# Patient Record
Sex: Female | Born: 1980 | Race: White | Hispanic: No | Marital: Married | Smoking: Never smoker
Health system: Southern US, Community
[De-identification: ages and names within clinical notes are randomized; demographics above are authoritative.]

## PROBLEM LIST (undated history)

## (undated) DIAGNOSIS — K5792 Diverticulitis of intestine, part unspecified, without perforation or abscess without bleeding: Secondary | ICD-10-CM

## (undated) HISTORY — DX: Diverticulitis of intestine, part unspecified, without perforation or abscess without bleeding: K57.92

---

## 2011-02-15 DIAGNOSIS — K5790 Diverticulosis of intestine, part unspecified, without perforation or abscess without bleeding: Secondary | ICD-10-CM | POA: Insufficient documentation

## 2011-02-15 DIAGNOSIS — N200 Calculus of kidney: Secondary | ICD-10-CM | POA: Insufficient documentation

## 2011-03-07 DIAGNOSIS — N83209 Unspecified ovarian cyst, unspecified side: Secondary | ICD-10-CM | POA: Insufficient documentation

## 2012-10-15 DIAGNOSIS — E669 Obesity, unspecified: Secondary | ICD-10-CM | POA: Insufficient documentation

## 2016-01-09 ENCOUNTER — Other Ambulatory Visit: Payer: Self-pay | Admitting: Internal Medicine

## 2016-01-09 ENCOUNTER — Encounter: Payer: Self-pay | Admitting: Internal Medicine

## 2016-01-11 ENCOUNTER — Encounter: Payer: Self-pay | Admitting: Internal Medicine

## 2016-01-11 ENCOUNTER — Ambulatory Visit (INDEPENDENT_AMBULATORY_CARE_PROVIDER_SITE_OTHER): Payer: Managed Care, Other (non HMO) | Admitting: Internal Medicine

## 2016-01-11 VITALS — BP 100/62 | HR 56 | Ht 62.0 in | Wt 139.0 lb

## 2016-01-11 DIAGNOSIS — L989 Disorder of the skin and subcutaneous tissue, unspecified: Secondary | ICD-10-CM | POA: Diagnosis not present

## 2016-01-11 DIAGNOSIS — R102 Pelvic and perineal pain: Secondary | ICD-10-CM | POA: Insufficient documentation

## 2016-01-11 DIAGNOSIS — R634 Abnormal weight loss: Secondary | ICD-10-CM | POA: Diagnosis not present

## 2016-01-11 LAB — POCT URINALYSIS DIPSTICK
BILIRUBIN UA: NEGATIVE
Blood, UA: NEGATIVE
Glucose, UA: NEGATIVE
KETONES UA: NEGATIVE
Leukocytes, UA: NEGATIVE
NITRITE UA: NEGATIVE
PH UA: 6
Protein, UA: NEGATIVE
Spec Grav, UA: <=1.005

## 2016-01-11 NOTE — Progress Notes (Signed)
Date:  01/11/2016   Name:  Kristina Gregory   DOB:  August 07, 1980   MRN:  [id number]   Chief Complaint: Establish Care (set up a pap appt- Mirena has been in for 4 years- having intermittent bleeding. Preg test neg last week- Mirena was put in by [facility])  She has been losing weight steadily over the past three years since her father's death. She was over 200 lbs and now 134 lbs at home.  She is very careful about her diet, exercises 6 days per week but has been at her current weight for 6 months.  Her goal weight is 125 lbs.  She has not had any blood work done in several years.   Abdominal Cramping  This is a new problem. The current episode started 1 to 4 weeks ago. The onset quality is gradual. The problem occurs constantly. The problem has been unchanged. The pain is located in the suprapubic region. The pain is mild. The quality of the pain is sharp. The abdominal pain does not radiate. Pertinent negatives include no constipation, diarrhea, dysuria, fever, frequency, headaches, nausea or vomiting. She has tried acetaminophen for the symptoms. The treatment provided mild relief.  her IUD is about 34 years old.  She has not had a pelvic or Pap in some time.  The pain occurs multiple times per day but only lasts a few seconds. She is having some vaginal spotting that she has not had previously.   Review of Systems  Constitutional: Negative for chills, fatigue and fever.  Respiratory: Negative for chest tightness and shortness of breath.   Cardiovascular: Negative for chest pain, palpitations and leg swelling.  Gastrointestinal: Positive for abdominal pain. Negative for constipation, diarrhea, nausea and vomiting.  Genitourinary: Positive for vaginal bleeding and vaginal pain. Negative for dysuria, frequency and vaginal discharge.  Skin: Negative for color change and rash.  Neurological: Negative for dizziness and headaches.  Hematological: Negative for adenopathy.    Patient Active  Problem List   Diagnosis Date Noted  . Adiposity 10/15/2012  . Cyst of ovary 03/07/2011  . DD (diverticular disease) 02/15/2011  . 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 Authorizing Provider  levonorgestrel (MIRENA) 20 MCG/24HR IUD by Intrauterine route.   Yes Historical Provider, MD    Allergies  Allergen Reactions  . Amoxicillin-Pot Clavulanate Itching    No past surgical history on file.  Social History  Substance Use Topics  . Smoking status: Never Smoker  . Smokeless tobacco: Not on file  . Alcohol use No     Medication list has been reviewed and updated.   Physical Exam  Constitutional: She is oriented to person, place, and time. She appears well-developed. No distress.  HENT:  Head: Normocephalic and atraumatic.  Neck: Normal range of motion. Neck supple. No thyromegaly present.  Cardiovascular: Normal rate, regular rhythm and normal heart sounds.   Pulmonary/Chest: Effort normal and breath sounds normal. No respiratory distress. She has no wheezes.  Abdominal: Soft. Bowel sounds are normal. She exhibits no distension and no mass. There is no tenderness. There is no rebound and no guarding.  Musculoskeletal: Normal range of motion. She exhibits no edema.  Neurological: She is alert and oriented to person, place, and time. She has normal reflexes.  Skin: Skin is warm and dry. No rash noted.     Psychiatric: She has a normal mood and affect. Her behavior is normal.  Thought content normal.  Nursing note and vitals reviewed.   BP 100/62   Pulse (!) 56   Ht 5\' 2"  (1.575 m)   Wt 139 lb (63 kg)   BMI 25.42 kg/m   Assessment and Plan: 1. Pelvic pain in female Recommend follow up with GYN Return here as needed for routine Pap and pelvic - POCT urinalysis dipstick  2. Benign skin lesion Patient reassured  3. Loss of weight 60+ pounds over the past three years; now at a plateau likely  due to being close to ideal weight Continue healthy diet and regular exercise Could obtain routine labs, TSH, etc if desired   Bari Edward, MD [facility]  01/11/2016

## 2016-01-11 NOTE — Patient Instructions (Signed)
Breast Self-Awareness Practicing breast self-awareness may pick up problems early, prevent significant medical complications, and possibly save your life. By practicing breast self-awareness, you can become familiar with how your breasts look and feel and if your breasts are changing. This allows you to notice changes early. It can also offer you some reassurance that your breast health is good. One way to learn what is normal for your breasts and whether your breasts are changing is to do a breast self-exam. If you find a lump or something that was not present in the past, it is best to contact your caregiver right away. Other findings that should be evaluated by your caregiver include nipple discharge, especially if it is bloody; skin changes or reddening; areas where the skin seems to be pulled in (retracted); or new lumps and bumps. Breast pain is seldom associated with cancer (malignancy), but should also be evaluated by a caregiver. HOW TO PERFORM A BREAST SELF-EXAM The best time to examine your breasts is 5-7 days after your menstrual period is over. During menstruation, the breasts are lumpier, and it may be more difficult to pick up changes. If you do not menstruate, have reached menopause, or had your uterus removed (hysterectomy), you should examine your breasts at regular intervals, such as monthly. If you are breastfeeding, examine your breasts after a feeding or after using a breast pump. Breast implants do not decrease the risk for lumps or tumors, so continue to perform breast self-exams as recommended. Talk to your caregiver about how to determine the difference between the implant and breast tissue. Also, talk about the amount of pressure you should use during the exam. Over time, you will become more familiar with the variations of your breasts and more comfortable with the exam. A breast self-exam requires you to remove all your clothes above the waist. 1. Look at your breasts and nipples.  Stand in front of a mirror in a room with good lighting. With your hands on your hips, push your hands firmly downward. Look for a difference in shape, contour, and size from one breast to the other (asymmetry). Asymmetry includes puckers, dips, or bumps. Also, look for skin changes, such as reddened or scaly areas on the breasts. Look for nipple changes, such as discharge, dimpling, repositioning, or redness. 2. Carefully feel your breasts. This is best done either in the shower or tub while using soapy water or when flat on your back. Place the arm (on the side of the breast you are examining) above your head. Use the pads (not the fingertips) of your three middle fingers on your opposite hand to feel your breasts. Start in the underarm area and use  inch (2 cm) overlapping circles to feel your breast. Use 3 different levels of pressure (light, medium, and firm pressure) at each circle before moving to the next circle. The light pressure is needed to feel the tissue closest to the skin. The medium pressure will help to feel breast tissue a little deeper, while the firm pressure is needed to feel the tissue close to the ribs. Continue the overlapping circles, moving downward over the breast until you feel your ribs below your breast. Then, move one finger-width towards the center of the body. Continue to use the  inch (2 cm) overlapping circles to feel your breast as you move slowly up toward the collar bone (clavicle) near the base of the neck. Continue the up and down exam using all 3 pressures until you reach the   middle of the chest. Do this with each breast, carefully feeling for lumps or changes. 3.  Keep a written record with breast changes or normal findings for each breast. By writing this information down, you do not need to depend only on memory for size, tenderness, or location. Write down where you are in your menstrual cycle, if you are still menstruating. Breast tissue can have some lumps or  thick tissue. However, see your caregiver if you find anything that concerns you.  SEEK MEDICAL CARE IF:  You see a change in shape, contour, or size of your breasts or nipples.   You see skin changes, such as reddened or scaly areas on the breasts or nipples.   You have an unusual discharge from your nipples.   You feel a new lump or unusually thick areas.    This information is not intended to replace advice given to you by your health care provider. Make sure you discuss any questions you have with your health care provider.   Document Released: 06/06/2005 Document Revised: 05/23/2012 Document Reviewed: 09/21/2011 Elsevier Interactive Patient Education 2016 [employer]  

## 2016-11-23 ENCOUNTER — Encounter: Payer: Self-pay | Admitting: *Deleted

## 2016-11-23 ENCOUNTER — Emergency Department
Admission: EM | Admit: 2016-11-23 | Discharge: 2016-11-24 | Disposition: A | Discharge status: Home / Self Care | Payer: Managed Care, Other (non HMO) | Attending: Emergency Medicine | Admitting: Emergency Medicine

## 2016-11-23 DIAGNOSIS — K529 Noninfective gastroenteritis and colitis, unspecified: Secondary | ICD-10-CM | POA: Insufficient documentation

## 2016-11-23 DIAGNOSIS — R109 Unspecified abdominal pain: Secondary | ICD-10-CM | POA: Diagnosis present

## 2016-11-23 LAB — COMPREHENSIVE METABOLIC PANEL
ALK PHOS: 31 U/L — AB (ref 38–126)
ALT: 17 U/L (ref 14–54)
ANION GAP: 11 (ref 5–15)
AST: 22 U/L (ref 15–41)
Albumin: 4.8 g/dL (ref 3.5–5.0)
BILIRUBIN TOTAL: 0.8 mg/dL (ref 0.3–1.2)
BUN: 11 mg/dL (ref 6–20)
CALCIUM: 9.4 mg/dL (ref 8.9–10.3)
CO2: 24 mmol/L (ref 22–32)
Chloride: 101 mmol/L (ref 101–111)
Creatinine, Ser: 0.51 mg/dL (ref 0.44–1.00)
GFR calc non Af Amer: >60 mL/min (ref >60)
GLUCOSE: 105 mg/dL — AB (ref 65–99)
Potassium: 3.6 mmol/L (ref 3.5–5.1)
Sodium: 136 mmol/L (ref 135–145)
TOTAL PROTEIN: 8.3 g/dL — AB (ref 6.5–8.1)

## 2016-11-23 LAB — URINALYSIS, COMPLETE (UACMP) WITH MICROSCOPIC
Bacteria, UA: NONE SEEN
Bilirubin Urine: NEGATIVE
GLUCOSE, UA: NEGATIVE mg/dL
Hgb urine dipstick: NEGATIVE
Ketones, ur: NEGATIVE mg/dL
Leukocytes, UA: NEGATIVE
NITRITE: NEGATIVE
PH: 5 (ref 5.0–8.0)
Protein, ur: NEGATIVE mg/dL
RBC / HPF: NONE SEEN RBC/hpf (ref 0–5)
SPECIFIC GRAVITY, URINE: 1.019 (ref 1.005–1.030)

## 2016-11-23 LAB — CBC
HCT: 40.6 % (ref 35.0–47.0)
HEMOGLOBIN: 13.8 g/dL (ref 12.0–16.0)
MCH: 29.6 pg (ref 26.0–34.0)
MCHC: 34.1 g/dL (ref 32.0–36.0)
MCV: 86.8 fL (ref 80.0–100.0)
PLATELETS: 266 10*3/uL (ref 150–440)
RBC: 4.68 MIL/uL (ref 3.80–5.20)
RDW: 12.3 % (ref 11.5–14.5)
WBC: 14.9 10*3/uL — ABNORMAL HIGH (ref 3.6–11.0)

## 2016-11-23 LAB — LIPASE, BLOOD: Lipase: 20 U/L (ref 11–51)

## 2016-11-23 LAB — POCT PREGNANCY, URINE: Preg Test, Ur: NEGATIVE

## 2016-11-23 MED ORDER — SODIUM CHLORIDE 0.9 % IV BOLUS (SEPSIS)
1000.0000 mL | Freq: Once | INTRAVENOUS | Status: AC
  Administered 2016-11-24: 1000 mL via INTRAVENOUS

## 2016-11-23 NOTE — ED Triage Notes (Signed)
Pt reports she has a headache and bodyaches.  Pt also reports having nausea and passed out tonight.  Pt states her abd hurts and feels like period cramps.  Pt also has low back pain.  Pt alert.

## 2016-11-24 ENCOUNTER — Emergency Department: Payer: Managed Care, Other (non HMO)

## 2016-11-24 MED ORDER — IOPAMIDOL (ISOVUE-300) INJECTION 61%
30.0000 mL | Freq: Once | INTRAVENOUS | Status: AC
  Administered 2016-11-24: 30 mL via ORAL

## 2016-11-24 MED ORDER — OXYCODONE-ACETAMINOPHEN 5-325 MG PO TABS: 1.0000 | ORAL_TABLET | Freq: Four times a day (QID) | ORAL | 0 refills | Status: AC | PRN

## 2016-11-24 MED ORDER — CIPROFLOXACIN HCL 500 MG PO TABS
500.0000 mg | ORAL_TABLET | Freq: Once | ORAL | Status: AC
  Administered 2016-11-24: 500 mg via ORAL
  Filled 2016-11-24: qty 1

## 2016-11-24 MED ORDER — METRONIDAZOLE 500 MG PO TABS: 500.0000 mg | ORAL_TABLET | Freq: Two times a day (BID) | ORAL | 0 refills | Status: AC

## 2016-11-24 MED ORDER — CIPROFLOXACIN HCL 500 MG PO TABS: 500.0000 mg | ORAL_TABLET | Freq: Two times a day (BID) | ORAL | 0 refills | Status: AC

## 2016-11-24 MED ORDER — OXYCODONE-ACETAMINOPHEN 5-325 MG PO TABS
2.0000 | ORAL_TABLET | Freq: Once | ORAL | Status: AC
  Administered 2016-11-24: 2 via ORAL
  Filled 2016-11-24: qty 2

## 2016-11-24 MED ORDER — MORPHINE SULFATE (PF) 4 MG/ML IV SOLN
4.0000 mg | Freq: Once | INTRAVENOUS | Status: AC
  Administered 2016-11-24: 4 mg via INTRAVENOUS
  Filled 2016-11-24: qty 1

## 2016-11-24 MED ORDER — ONDANSETRON HCL 4 MG/2ML IJ SOLN
4.0000 mg | Freq: Once | INTRAMUSCULAR | Status: AC
  Administered 2016-11-24: 4 mg via INTRAVENOUS
  Filled 2016-11-24: qty 2

## 2016-11-24 MED ORDER — METRONIDAZOLE 500 MG PO TABS
500.0000 mg | ORAL_TABLET | Freq: Once | ORAL | Status: AC
  Administered 2016-11-24: 500 mg via ORAL
  Filled 2016-11-24: qty 1

## 2016-11-24 MED ORDER — IOPAMIDOL (ISOVUE-300) INJECTION 61%
100.0000 mL | Freq: Once | INTRAVENOUS | Status: AC | PRN
  Administered 2016-11-24: 100 mL via INTRAVENOUS

## 2016-11-24 NOTE — ED Provider Notes (Signed)
[facility] Emergency Department Provider Note   ____________________________________________   First MD Initiated Contact with Patient 11/23/16 2351     (approximate)  I have reviewed the triage vital signs and the nursing notes.   HISTORY  Chief Complaint Abdominal Pain    HPI Kristina Gregory is a 36 y.o. female who comes into the hospital today with abdominal pain headache and body aches. The patient also had some syncopal episodes. She reports that the headache started about 7:30. She had taken her trazodone and then started feeling achy. The patient reports she was in the car coming home from church and then started feeling nauseous and dizzy. The patient felt like she is going to pass out and her husband stopped the car where she vomited and then passed out. The patient's husband reports it was only for a few seconds. He brought her straight here which she states that she passed out in the car as well as in the waiting room. She states she developed some lower abdominal pain on the left side is radiating to her back. It feels like cramps and she rates the pain a 6 out of 10 in intensity. She reports that the pain seems to be worsening. She's been taking melatonin as well as trazodone to help sleep. The patient has had a lot of stress recently as her mother was killed by her brother. She did not eat much today. She's had occasional headaches but nothing like this before. She reports it was due to dehydration. The patient is here today for evaluation of the symptoms.   Past Medical History:  Diagnosis Date  . Diverticulitis     Patient Active Problem List   Diagnosis Date Noted  . Loss of weight 01/11/2016  . Benign skin lesion 01/11/2016  . Pelvic pain in female 01/11/2016  . Cyst of ovary 03/07/2011  . DD (diverticular disease) 02/15/2011  . Calculus of kidney 02/15/2011    No past surgical history on file.  Prior to Admission medications     Medication Sig Start Date End Date Taking? Authorizing Provider  ciprofloxacin (CIPRO) 500 MG tablet Take 1 tablet (500 mg total) by mouth 2 (two) times daily. 11/24/16 12/01/16  Rebecka Apley, MD  levonorgestrel (MIRENA) 20 MCG/24HR IUD by Intrauterine route.    [provider]  metroNIDAZOLE (FLAGYL) 500 MG tablet Take 1 tablet (500 mg total) by mouth 2 (two) times daily. 11/24/16 12/01/16  Rebecka Apley, MD  oxyCODONE-acetaminophen (ROXICET) 5-325 MG tablet Take 1 tablet by mouth every 6 (six) hours as needed. 11/24/16   Rebecka Apley, MD    Allergies Amoxicillin-pot clavulanate  Family History  Problem Relation Age of Onset  . Diabetes Father   . Heart disease Father   . Heart disease Maternal Grandmother   . Cancer Maternal Grandfather   . Cancer Paternal Grandmother   . Cancer Paternal Grandfather   . Diabetes Paternal Grandfather   . Hypertension Mother   . Hypercholesterolemia Mother     Social History Social History  Substance Use Topics  . Smoking status: Never Smoker  . Smokeless tobacco: Never Used  . Alcohol use No    Review of Systems  Constitutional: chills Eyes: No visual changes. ENT: No sore throat. Cardiovascular: Denies chest pain. Respiratory: Denies shortness of breath. Gastrointestinal:  abdominal pain.  No nausea, no vomiting.  No diarrhea.  No constipation. Genitourinary: Negative for dysuria. Musculoskeletal: Negative for back pain. Skin: Negative for rash. Neurological:  Headache and syncope   ____________________________________________   PHYSICAL EXAM:  VITAL SIGNS: ED Triage Vitals  Enc Vitals Group     BP 11/23/16 2213 (!) 95/59     Pulse Rate 11/23/16 2213 94     Resp 11/23/16 2213 20     Temp 11/23/16 2213 97.7 F (36.5 C)     Temp Source 11/23/16 2213 Oral     SpO2 11/23/16 2213 99 %     Weight 11/23/16 2209 156 lb (70.8 kg)     Height 11/23/16 2209 5\' 2"  (1.575 m)     Head Circumference --      Peak  Flow --      Pain Score 11/23/16 2209 10     Pain Loc --      Pain Edu? --      Excl. in GC? --     Constitutional: Alert and oriented. Well appearing and in Moderate distress. Eyes: Conjunctivae are normal. PERRL. EOMI. Head: Atraumatic. Nose: No congestion/rhinnorhea. Mouth/Throat: Mucous membranes are moist.  Oropharynx non-erythematous. Cardiovascular: Normal rate, regular rhythm. Grossly normal heart sounds.  Good peripheral circulation. Respiratory: Normal respiratory effort.  No retractions. Lungs CTAB. Gastrointestinal: Soft with some left lower quadrant tenderness to palpation. No distention.  Genitourinary: patient refused Musculoskeletal: No lower extremity tenderness nor edema.  Neurologic:  Normal speech and language. Cranial nerves II through XII are grossly intact with no focal motor or neuro deficits Skin:  Skin is warm, dry and intact.  Psychiatric: Mood and affect are normal.   ____________________________________________   LABS (all labs ordered are listed, but only abnormal results are displayed)  Labs Reviewed  COMPREHENSIVE METABOLIC PANEL - Abnormal; Notable for the following:       Result Value   Glucose, Bld 105 (*)    Total Protein 8.3 (*)    Alkaline Phosphatase 31 (*)    All other components within normal limits  CBC - Abnormal; Notable for the following:    WBC 14.9 (*)    All other components within normal limits  URINALYSIS, COMPLETE (UACMP) WITH MICROSCOPIC - Abnormal; Notable for the following:    Color, Urine YELLOW (*)    APPearance HAZY (*)    Squamous Epithelial / LPF 0-5 (*)    All other components within normal limits  LIPASE, BLOOD  POC URINE PREG, ED  POCT PREGNANCY, URINE   ____________________________________________  EKG  none ____________________________________________  RADIOLOGY  US Transvaginal Non-ob  Result Date: 11/24/2016 CLINICAL DATA:  Initial evaluation for acute left lower quadrant pain. EXAM:  TRANSABDOMINAL AND TRANSVAGINAL ULTRASOUND OF PELVIS DOPPLER ULTRASOUND OF OVARIES TECHNIQUE: Both transabdominal and transvaginal ultrasound examinations of the pelvis were performed. Transabdominal technique was performed for global imaging of the pelvis including uterus, ovaries, adnexal regions, and pelvic cul-de-sac. It was necessary to proceed with endovaginal exam following the transabdominal exam to visualize the uterus and ovaries. Color and duplex Doppler ultrasound was utilized to evaluate blood flow to the ovaries. COMPARISON:  None. FINDINGS: Uterus Measurements: 7.0 x 4.8 x 6.4 cm. No fibroids or other mass visualized. Endometrium Thickness: 7.8 mm.  No focal abnormality visualized. Right ovary Measurements: 3.4 x 1.8 x 1.8 cm. Normal appearance/no adnexal mass. Left ovary Measurements: 3.6 x 2.4 x 2.8 cm. Normal appearance/no adnexal mass. Pulsed Doppler evaluation of both ovaries demonstrates normal low-resistance arterial and venous waveforms. Other findings No abnormal free fluid. IMPRESSION: Normal pelvic ultrasound.  No acute abnormality identified. Electronically Signed   By: Janell Quiet.D.  On: 11/24/2016 01:33   [place] Pelvis Complete  Result Date: 11/24/2016 CLINICAL DATA:  Initial evaluation for acute left lower quadrant pain. EXAM: TRANSABDOMINAL AND TRANSVAGINAL ULTRASOUND OF PELVIS DOPPLER ULTRASOUND OF OVARIES TECHNIQUE: Both transabdominal and transvaginal ultrasound examinations of the pelvis were performed. Transabdominal technique was performed for global imaging of the pelvis including uterus, ovaries, adnexal regions, and pelvic cul-de-sac. It was necessary to proceed with endovaginal exam following the transabdominal exam to visualize the uterus and ovaries. Color and duplex Doppler ultrasound was utilized to evaluate blood flow to the ovaries. COMPARISON:  None. FINDINGS: Uterus Measurements: 7.0 x 4.8 x 6.4 cm. No fibroids or other mass visualized. Endometrium  Thickness: 7.8 mm.  No focal abnormality visualized. Right ovary Measurements: 3.4 x 1.8 x 1.8 cm. Normal appearance/no adnexal mass. Left ovary Measurements: 3.6 x 2.4 x 2.8 cm. Normal appearance/no adnexal mass. Pulsed Doppler evaluation of both ovaries demonstrates normal low-resistance arterial and venous waveforms. Other findings No abnormal free fluid. IMPRESSION: Normal pelvic ultrasound.  No acute abnormality identified. Electronically Signed   By: Rise MuBenjamin  McClintock M.D.   On: 11/24/2016 01:33   Ct Abdomen Pelvis W Contrast  Result Date: 11/24/2016 CLINICAL DATA:  Body ache, nausea and syncope. Abdomen hurts and feels like menstrual cramps. Low back pain. History of diverticulitis. EXAM: CT ABDOMEN AND PELVIS WITH CONTRAST TECHNIQUE: Multidetector CT imaging of the abdomen and pelvis was performed using the standard protocol following bolus administration of intravenous contrast. CONTRAST:  100mL ISOVUE-300 IOPAMIDOL (ISOVUE-300) INJECTION 61% COMPARISON:  None. FINDINGS: Lower chest: No acute abnormality. Hepatobiliary: No focal liver abnormality is seen. No gallstones, gallbladder wall thickening, or biliary dilatation. Pancreas: Unremarkable. No pancreatic ductal dilatation or surrounding inflammatory changes. Spleen: Normal in size without focal abnormality. Adrenals/Urinary Tract: Adrenal glands are unremarkable. Kidneys are normal, without renal calculi, focal lesion, or hydronephrosis. Bladder is unremarkable. Stomach/Bowel: Contrast distended stomach. Abnormal segmental transmural thickening of proximal sigmoid colon with pericolonic inflammation consistent with a moderate degree of colitis. No free air nor bowel obstruction. Normal-appearing appendix. Vascular/Lymphatic: No significant vascular findings are present. No enlarged abdominal or pelvic lymph nodes. Reproductive: Corpus luteal cyst in the left ovary measuring 17 mm. Normal right ovary and uterus. Other: No abdominal wall hernia or  abnormality. No abdominopelvic ascites. Musculoskeletal: No acute or significant osseous findings. IMPRESSION: 1. Segmental transmural thickening of moderate degree involving the proximal sigmoid colon with pericolonic inflammation consistent with a short segment of colitis. No free air, abscess or bowel obstruction is identified. 2. Corpus luteal cyst of the left ovary measuring 17 mm. Electronically Signed   By: Tollie Ethavid  Kwon M.D.   On: 11/24/2016 02:51   [place] Art/ven Flow Abd Pelv Doppler  Result Date: 11/24/2016 CLINICAL DATA:  Initial evaluation for acute left lower quadrant pain. EXAM: TRANSABDOMINAL AND TRANSVAGINAL ULTRASOUND OF PELVIS DOPPLER ULTRASOUND OF OVARIES TECHNIQUE: Both transabdominal and transvaginal ultrasound examinations of the pelvis were performed. Transabdominal technique was performed for global imaging of the pelvis including uterus, ovaries, adnexal regions, and pelvic cul-de-sac. It was necessary to proceed with endovaginal exam following the transabdominal exam to visualize the uterus and ovaries. Color and duplex Doppler ultrasound was utilized to evaluate blood flow to the ovaries. COMPARISON:  None. FINDINGS: Uterus Measurements: 7.0 x 4.8 x 6.4 cm. No fibroids or other mass visualized. Endometrium Thickness: 7.8 mm.  No focal abnormality visualized. Right ovary Measurements: 3.4 x 1.8 x 1.8 cm. Normal appearance/no adnexal mass. Left ovary Measurements: 3.6 x 2.4 x 2.8 cm.  Normal appearance/no adnexal mass. Pulsed Doppler evaluation of both ovaries demonstrates normal low-resistance arterial and venous waveforms. Other findings No abnormal free fluid. IMPRESSION: Normal pelvic ultrasound.  No acute abnormality identified. Electronically Signed   By: Rise Mu M.D.   On: 11/24/2016 01:33    ____________________________________________   PROCEDURES  Procedure(s) performed: None  Procedures  Critical Care performed:  No  ____________________________________________   INITIAL IMPRESSION / ASSESSMENT AND PLAN / ED COURSE  Pertinent labs & imaging results that were available during my care of the patient were reviewed by me and considered in my medical decision making (see chart for details).  This is a 36 year old female who comes into the hospital today with some abdominal pain, chills, headache and body aches. The patient also had a few episodes of syncope. We did check orthostatic vital signs and the patient was not orthostatic. I gave the patient liter of normal saline as well as some morphine. I initially sent her for an ultrasound with a concern for a cyst but it was negative. I then sent the patient for a CT scan which showed some sigmoid colitis. I feel that this may be causing the patient's symptoms. I did give her some metronidazole and ciprofloxacin. The patient also received a Percocet as her pain did return. I feel that this is the cause of the patient's symptoms that she may be discharged to home. The patient understands this plan. She'll be discharged to follow-up with her primary care physician.  Clinical Course as of Nov 25 426  Thu Nov 24, 2016  0158 Normal pelvic ultrasound.  No acute abnormality identified. US Pelvis Complete [AW]  0307 1. Segmental transmural thickening of moderate degree involving the proximal sigmoid colon with pericolonic inflammation consistent with a short segment of colitis. No free air, abscess or bowel obstruction is identified. 2. Corpus luteal cyst of the left ovary measuring 17 mm.   CT Abdomen Pelvis W Contrast [AW]    Clinical Course User Index [AW] Rebecka Apley, MD     ____________________________________________   FINAL CLINICAL IMPRESSION(S) / ED DIAGNOSES  Final diagnoses:  Abdominal pain  Pain in the abdomen  Colitis      NEW MEDICATIONS STARTED DURING THIS VISIT:  Discharge Medication List as of 11/24/2016  3:39 AM    START  taking these medications   Details  ciprofloxacin (CIPRO) 500 MG tablet Take 1 tablet (500 mg total) by mouth 2 (two) times daily., Starting Thu 11/24/2016, Until Thu 12/01/2016, Print    metroNIDAZOLE (FLAGYL) 500 MG tablet Take 1 tablet (500 mg total) by mouth 2 (two) times daily., Starting Thu 11/24/2016, Until Thu 12/01/2016, Print    oxyCODONE-acetaminophen (ROXICET) 5-325 MG tablet Take 1 tablet by mouth every 6 (six) hours as needed., Starting Thu 11/24/2016, Print         Note:  This document was prepared using Dragon voice recognition software and may include unintentional dictation errors.    Rebecka Apley, MD 11/24/16 [phone]

## 2016-11-24 NOTE — ED Notes (Signed)
Patient transported to CT 

## 2016-11-24 NOTE — Discharge Instructions (Signed)
Please follow up with your primary care physician.

## 2016-11-24 NOTE — ED Notes (Signed)
Patient transported to Ultrasound 

## 2017-11-21 IMAGING — US US PELVIS COMPLETE
1 series · 14 of 25 positions shown · non-contrast
Comparison: None.

CLINICAL DATA: Initial evaluation for acute left lower quadrant
pain.

EXAM:
TRANSABDOMINAL AND TRANSVAGINAL ULTRASOUND OF PELVIS
DOPPLER ULTRASOUND OF OVARIES
TECHNIQUE: Both transabdominal and transvaginal ultrasound examinations of the
pelvis were performed. Transabdominal technique was performed for
global imaging of the pelvis including uterus, ovaries, adnexal
regions, and pelvic cul-de-sac.
It was necessary to proceed with endovaginal exam following the
transabdominal exam to visualize the uterus and ovaries. Color and
duplex Doppler ultrasound was utilized to evaluate blood flow to the
ovaries.

[Series 1: us pelvis complete · 0.20mm/px · 14 of 73 slices shown]
[im 1/73]
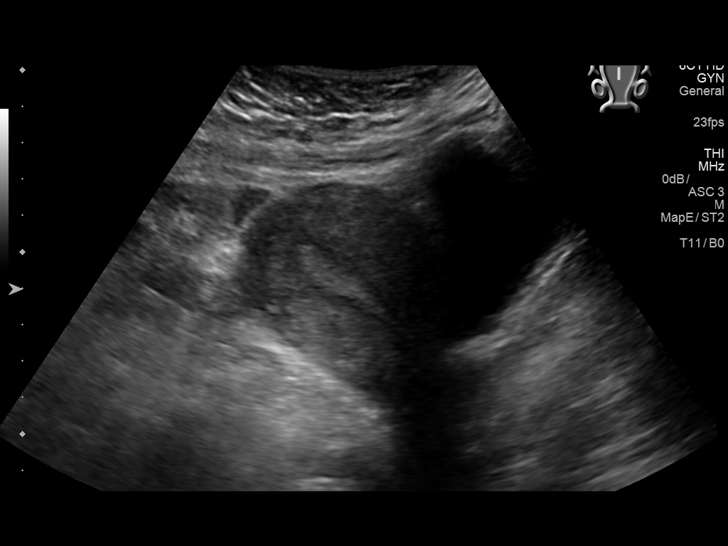
[im 7/73]
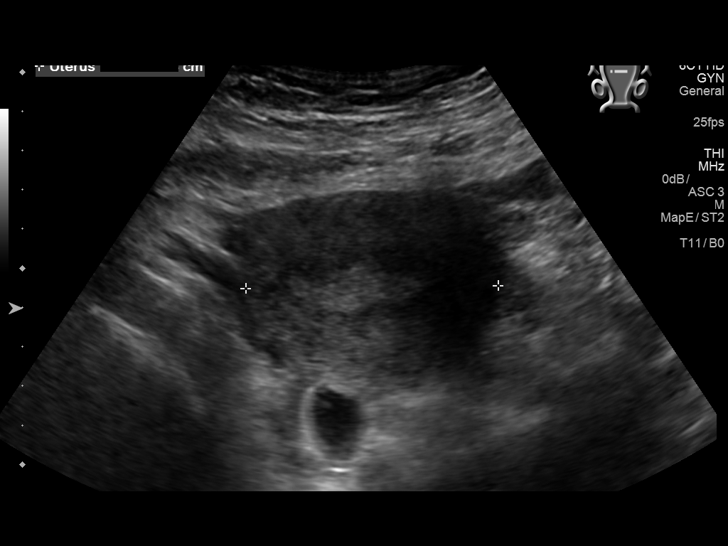
[im 13/73]
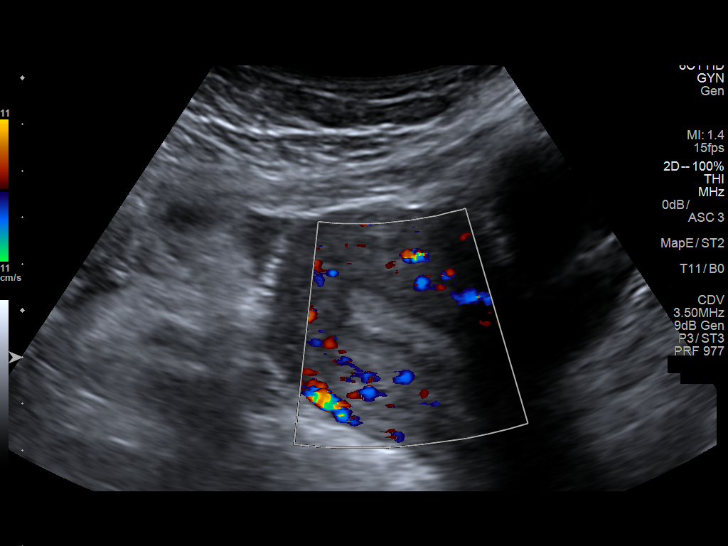
[im 19/73]
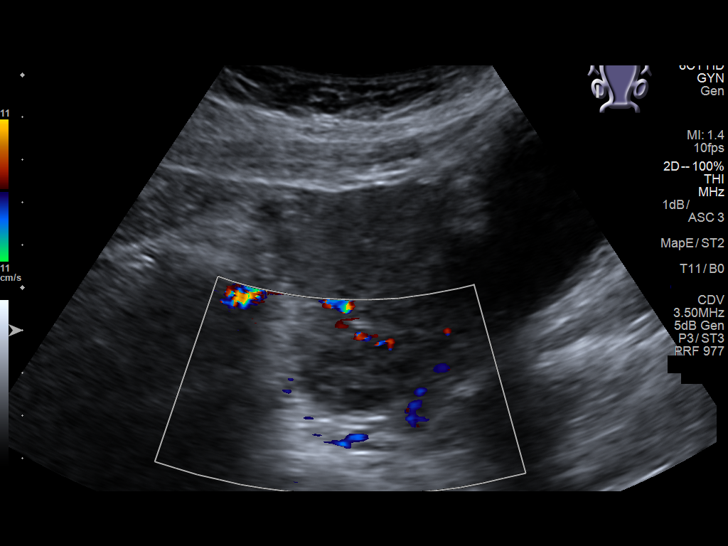
[im 25/73]
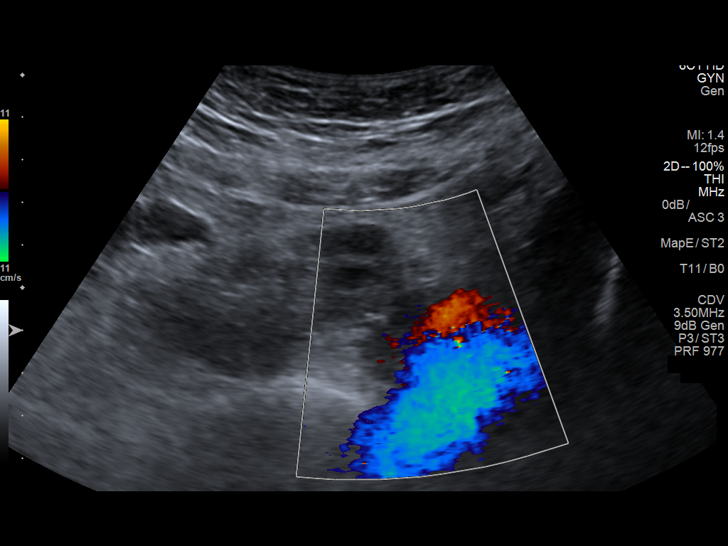
[im 28/73]
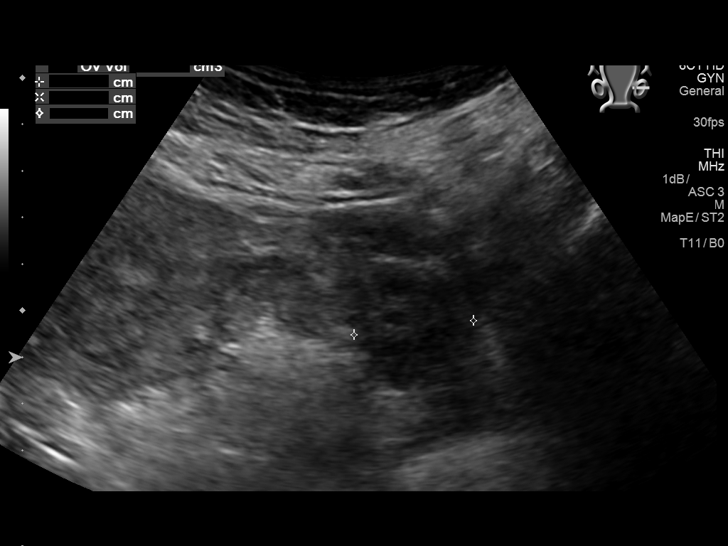
[im 34/73]
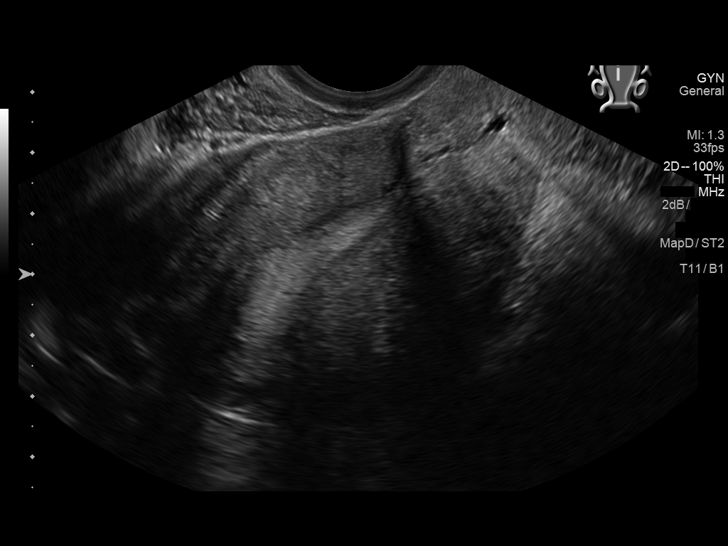
[im 40/73]
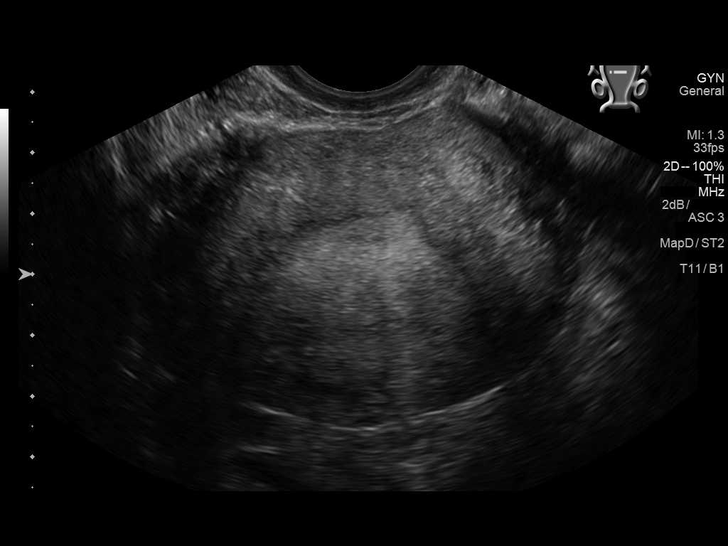
[im 46/73]
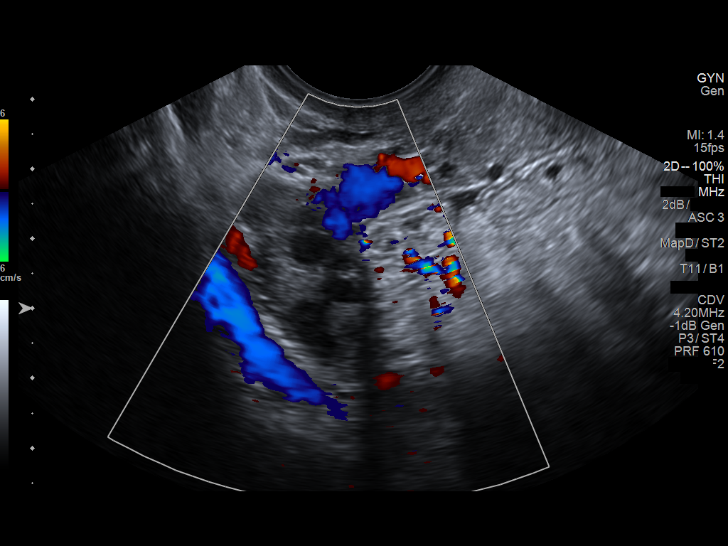
[im 49/73]
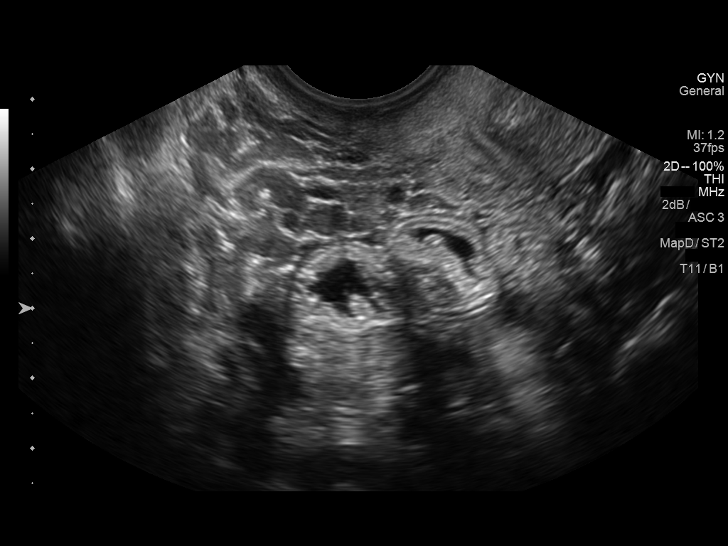
[im 55/73]
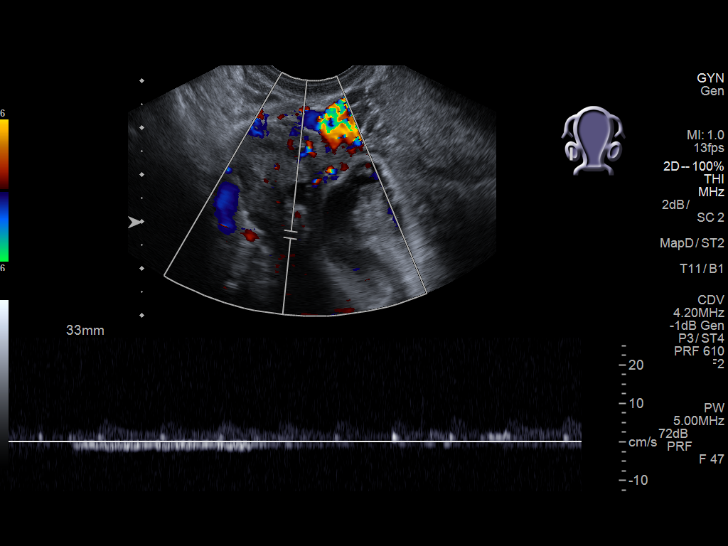
[im 61/73]
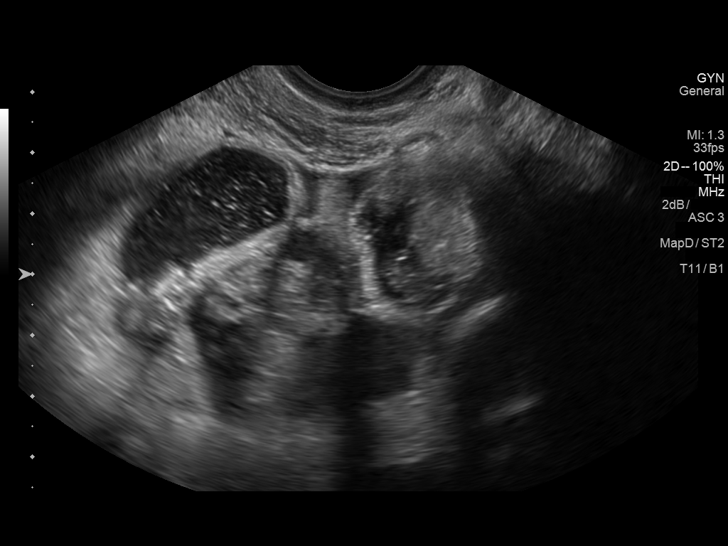
[im 67/73]
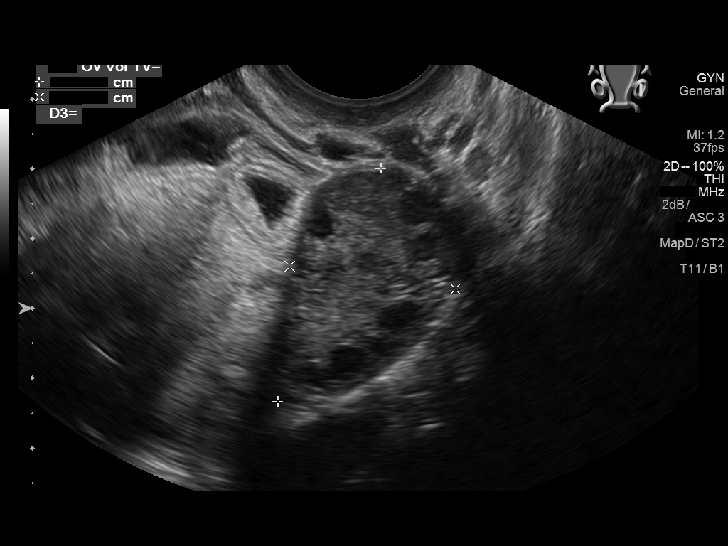
[im 73/73]
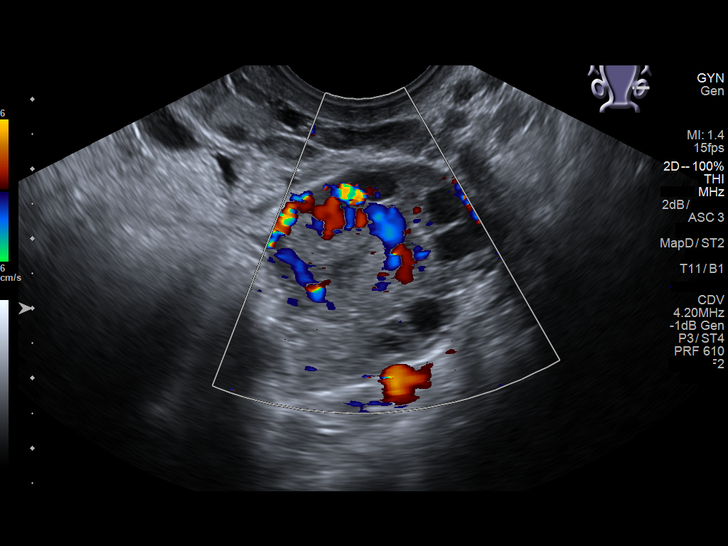

[14 of 25 positions shown; findings below may reference images not displayed]

FINDINGS: Uterus

Measurements: 7.0 x 4.8 x 6.4 cm. No fibroids or other mass
visualized.

Endometrium

Thickness: 7.8 mm.  No focal abnormality visualized.

Right ovary

Measurements: 3.4 x 1.8 x 1.8 cm. Normal appearance/no adnexal mass.

Left ovary

Measurements: 3.6 x 2.4 x 2.8 cm. Normal appearance/no adnexal mass.

Pulsed Doppler evaluation of both ovaries demonstrates normal
low-resistance arterial and venous waveforms.

Other findings

No abnormal free fluid.
IMPRESSION: Normal pelvic ultrasound.  No acute abnormality identified.

## 2018-07-16 IMAGING — CT CT ABD-PELV W/ CM
2 of 4 series · 15 of 46 positions shown, 17 images · IV contrast (APPLIED)
Comparison: None.

CLINICAL DATA: Body ache, nausea and syncope. Abdomen hurts and
feels like menstrual cramps. Low back pain. History of
diverticulitis.

EXAM:
CT ABDOMEN AND PELVIS WITH CONTRAST
TECHNIQUE: Multidetector CT imaging of the abdomen and pelvis was performed
using the standard protocol following bolus administration of
intravenous contrast.
CONTRAST:  100mL [id number] IOPAMIDOL ([id number]) INJECTION 61%

[Series 2: routine abd/pel with · axial · 0.93mm/px · z∈[-767,-342]mm · 12 of 93 slices shown, 14 images]
[im 4/93  soft-tissue]
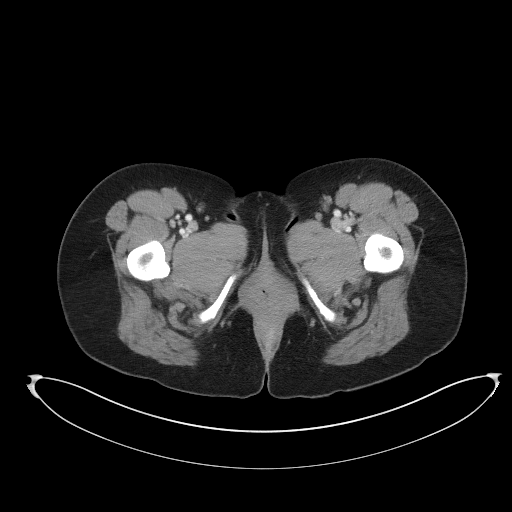
[im 4/93  bone]
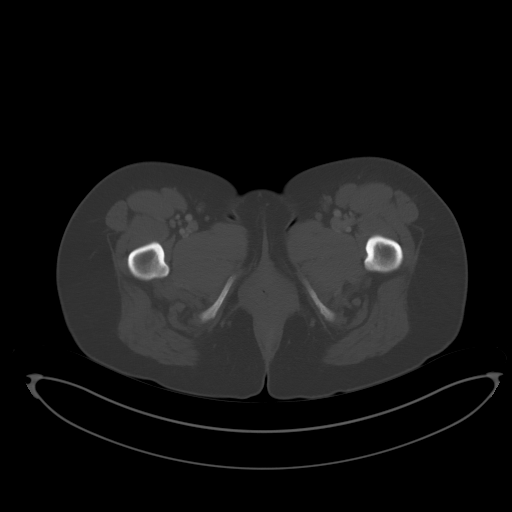
[im 12/93  soft-tissue]
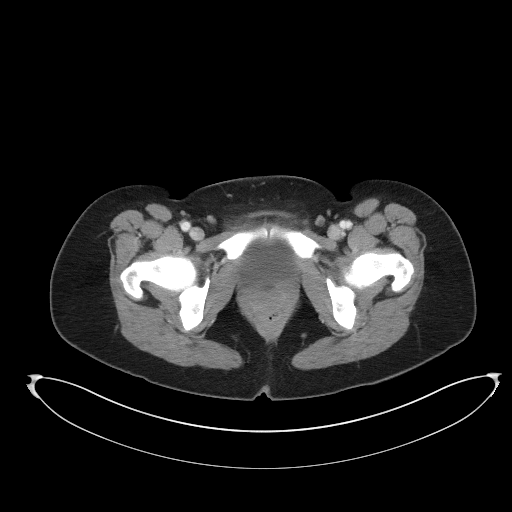
[im 20/93  soft-tissue]
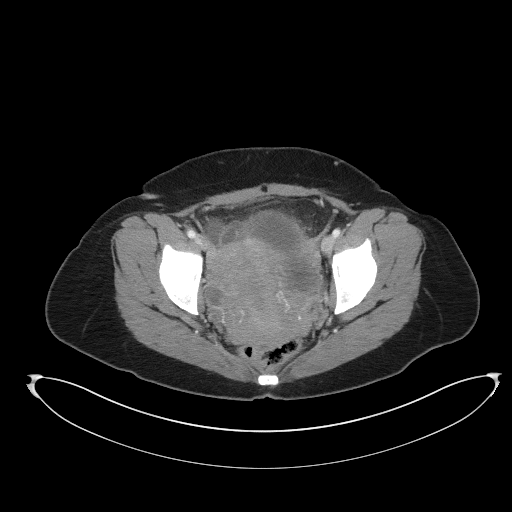
[im 27/93  soft-tissue]
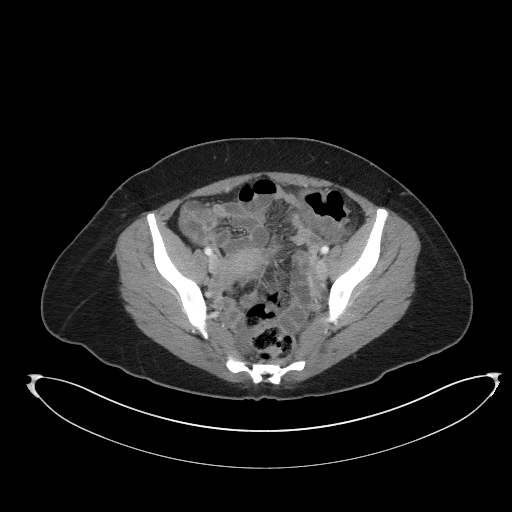
[im 35/93  soft-tissue]
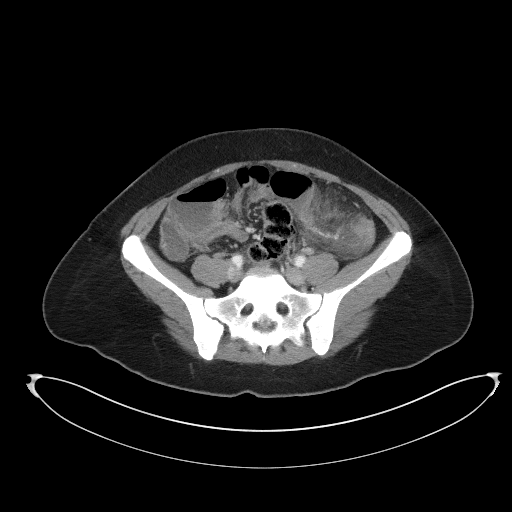
[im 43/93  soft-tissue]
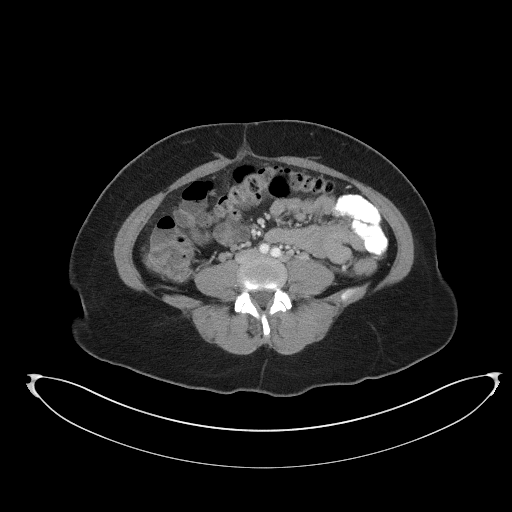
[im 50/93  soft-tissue]
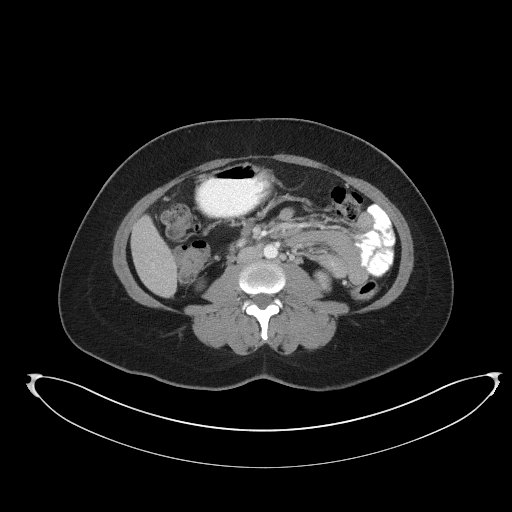
[im 58/93  soft-tissue]
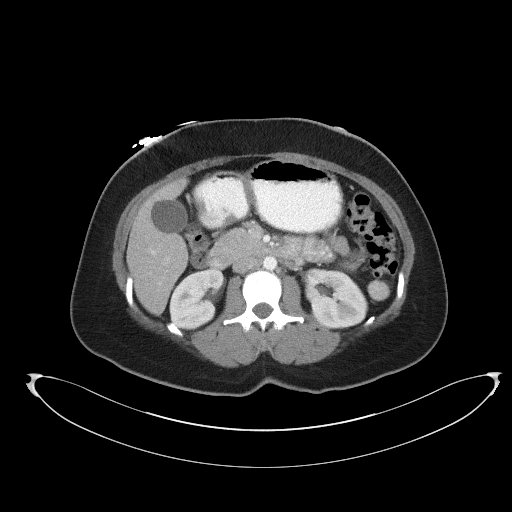
[im 66/93  soft-tissue]
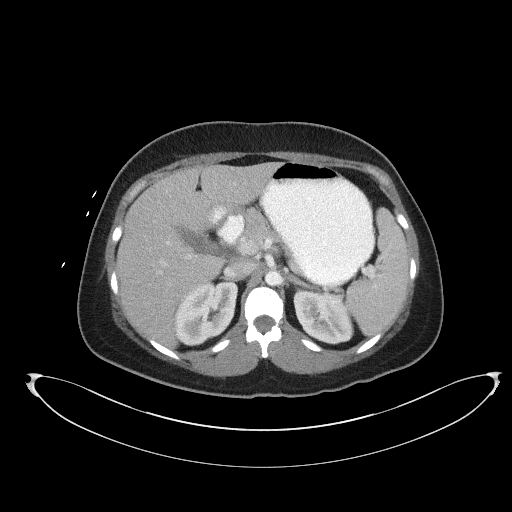
[im 66/93  bone]
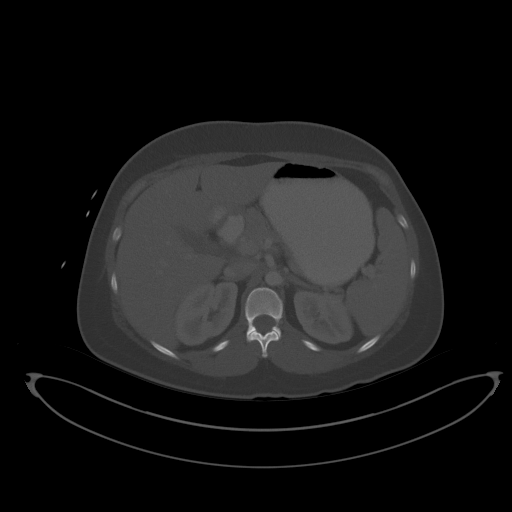
[im 73/93  soft-tissue]
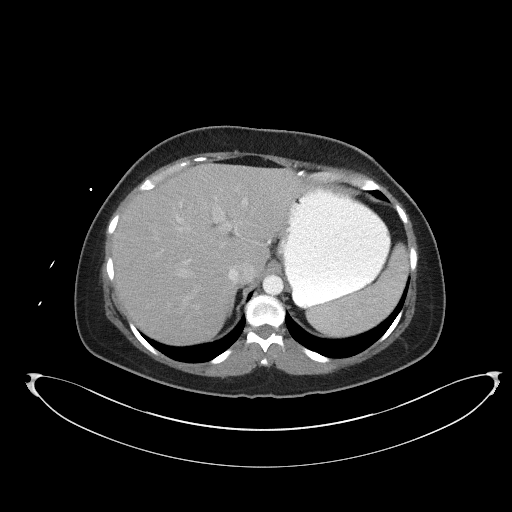
[im 81/93  soft-tissue]
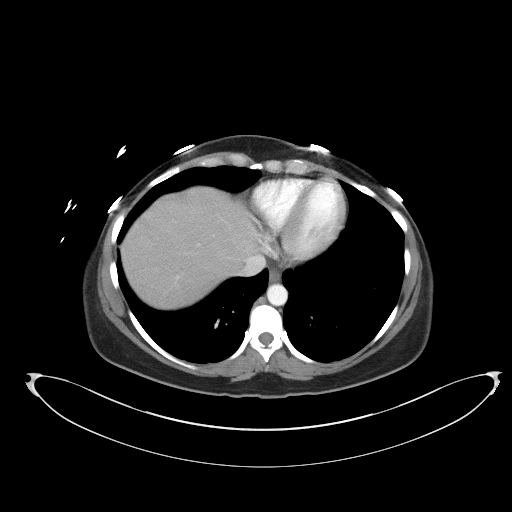
[im 89/93  soft-tissue]
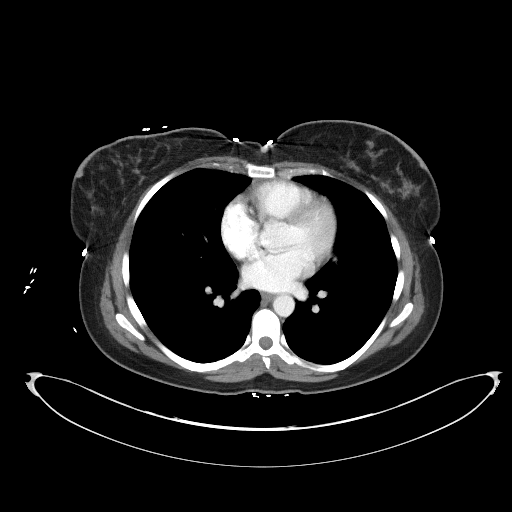

[Series 5: coronal st · coronal · 0.65mm/px · 3 of 79 slices shown]
[im 27/79  soft-tissue]
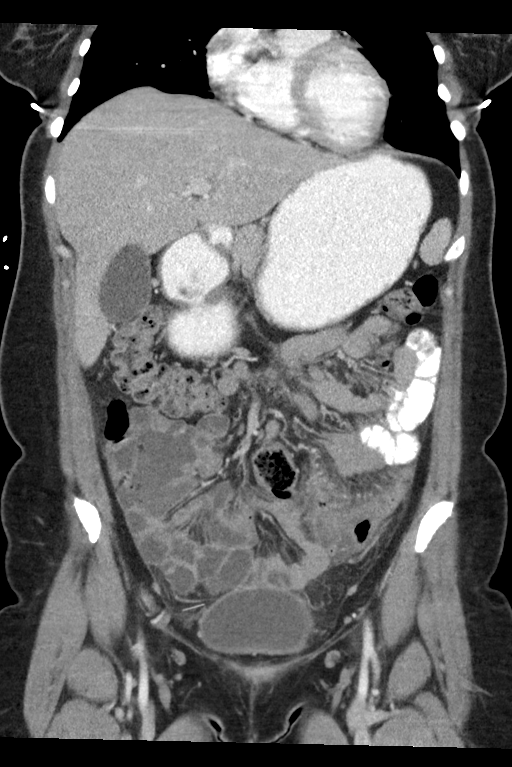
[im 35/79  soft-tissue]
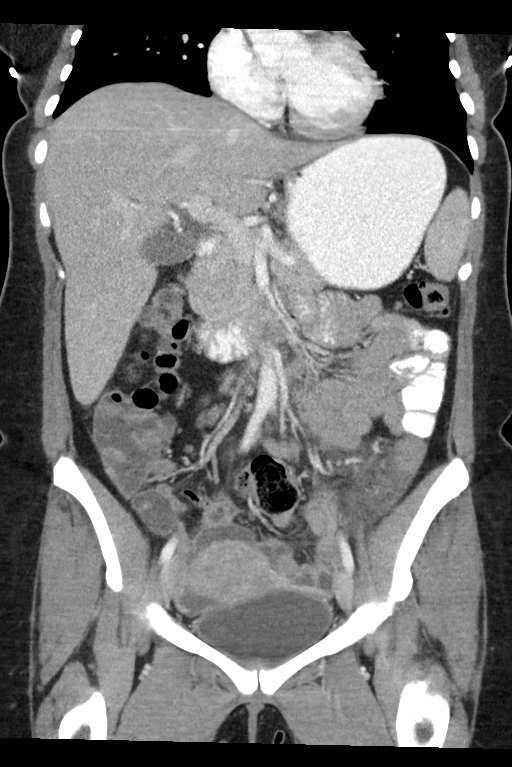
[im 44/79  soft-tissue]
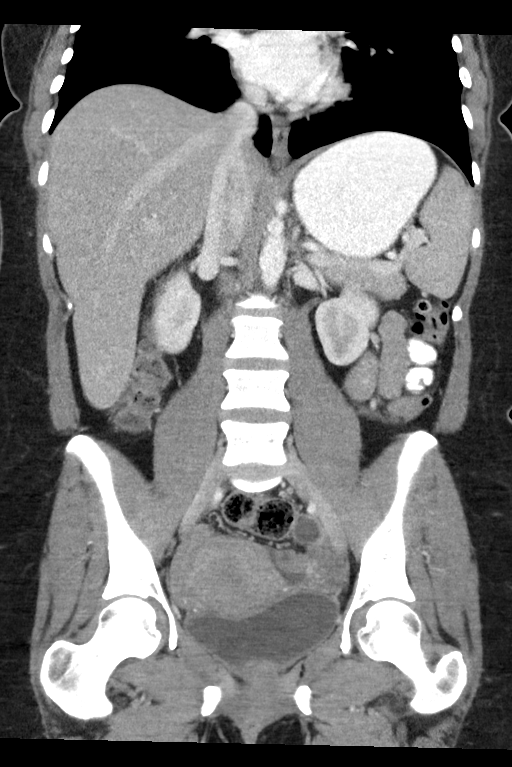

[15 of 46 positions shown; findings below may reference images not displayed]

FINDINGS: Lower chest: No acute abnormality.

Hepatobiliary: No focal liver abnormality is seen. No gallstones,
gallbladder wall thickening, or biliary dilatation.

Pancreas: Unremarkable. No pancreatic ductal dilatation or
surrounding inflammatory changes.

Spleen: Normal in size without focal abnormality.

Adrenals/Urinary Tract: Adrenal glands are unremarkable. Kidneys are
normal, without renal calculi, focal lesion, or hydronephrosis.
Bladder is unremarkable.

Stomach/Bowel: Contrast distended stomach. Abnormal segmental
transmural thickening of proximal sigmoid colon with pericolonic
inflammation consistent with a moderate degree of colitis. No free
air nor bowel obstruction. Normal-appearing appendix.

Vascular/Lymphatic: No significant vascular findings are present. No
enlarged abdominal or pelvic lymph nodes.

Reproductive: Corpus luteal cyst in the left ovary measuring 17 mm.
Normal right ovary and uterus.

Other: No abdominal wall hernia or abnormality. No abdominopelvic
ascites.

Musculoskeletal: No acute or significant osseous findings.
IMPRESSION: 1. Segmental transmural thickening of moderate degree involving the
proximal sigmoid colon with pericolonic inflammation consistent with
a short segment of colitis. No free air, abscess or bowel
obstruction is identified.
2. Corpus luteal cyst of the left ovary measuring 17 mm.
